# Patient Record
Sex: Female | Born: 1950 | Hispanic: No | Marital: Married | Smoking: Never smoker
Health system: Southern US, Community
[De-identification: ages and names within clinical notes are randomized; demographics above are authoritative.]

## PROBLEM LIST (undated history)

## (undated) DIAGNOSIS — J849 Interstitial pulmonary disease, unspecified: Secondary | ICD-10-CM

## (undated) DIAGNOSIS — I4891 Unspecified atrial fibrillation: Secondary | ICD-10-CM

## (undated) DIAGNOSIS — E119 Type 2 diabetes mellitus without complications: Secondary | ICD-10-CM

## (undated) DIAGNOSIS — M1990 Unspecified osteoarthritis, unspecified site: Secondary | ICD-10-CM

## (undated) DIAGNOSIS — D649 Anemia, unspecified: Secondary | ICD-10-CM

## (undated) DIAGNOSIS — R011 Cardiac murmur, unspecified: Secondary | ICD-10-CM

## (undated) DIAGNOSIS — I2699 Other pulmonary embolism without acute cor pulmonale: Secondary | ICD-10-CM

## (undated) DIAGNOSIS — J449 Chronic obstructive pulmonary disease, unspecified: Secondary | ICD-10-CM

## (undated) DIAGNOSIS — Z952 Presence of prosthetic heart valve: Secondary | ICD-10-CM

## (undated) DIAGNOSIS — J45909 Unspecified asthma, uncomplicated: Secondary | ICD-10-CM

## (undated) DIAGNOSIS — Z5189 Encounter for other specified aftercare: Secondary | ICD-10-CM

## (undated) DIAGNOSIS — I50810 Right heart failure, unspecified: Secondary | ICD-10-CM

## (undated) DIAGNOSIS — I10 Essential (primary) hypertension: Secondary | ICD-10-CM

## (undated) DIAGNOSIS — I272 Pulmonary hypertension, unspecified: Secondary | ICD-10-CM

## (undated) DIAGNOSIS — G459 Transient cerebral ischemic attack, unspecified: Secondary | ICD-10-CM

## (undated) HISTORY — PX: OTHER SURGICAL HISTORY: SHX169

## (undated) HISTORY — PX: TUBAL LIGATION: SHX77

## (undated) HISTORY — PX: CARDIAC CATHETERIZATION: SHX172

---

## 2000-05-17 ENCOUNTER — Ambulatory Visit (HOSPITAL_COMMUNITY): Admission: RE | Admit: 2000-05-17 | Discharge: 2000-05-17 | Payer: Self-pay | Admitting: Gastroenterology

## 2003-07-23 ENCOUNTER — Other Ambulatory Visit: Payer: Self-pay

## 2004-05-27 ENCOUNTER — Ambulatory Visit: Payer: Self-pay | Admitting: Internal Medicine

## 2004-06-15 ENCOUNTER — Ambulatory Visit: Payer: Self-pay | Admitting: Internal Medicine

## 2004-07-20 ENCOUNTER — Ambulatory Visit: Payer: Self-pay | Admitting: Specialist

## 2004-07-21 ENCOUNTER — Inpatient Hospital Stay: Payer: Self-pay | Admitting: Urology

## 2004-08-03 ENCOUNTER — Ambulatory Visit: Payer: Self-pay | Admitting: Internal Medicine

## 2005-01-24 ENCOUNTER — Other Ambulatory Visit: Payer: Self-pay

## 2005-01-24 ENCOUNTER — Inpatient Hospital Stay: Payer: Self-pay | Admitting: Specialist

## 2005-02-17 ENCOUNTER — Other Ambulatory Visit: Payer: Self-pay

## 2005-02-17 ENCOUNTER — Ambulatory Visit: Payer: Self-pay | Admitting: Specialist

## 2005-02-21 ENCOUNTER — Ambulatory Visit: Payer: Self-pay | Admitting: Specialist

## 2005-04-19 ENCOUNTER — Other Ambulatory Visit: Payer: Self-pay

## 2005-04-19 ENCOUNTER — Inpatient Hospital Stay: Payer: Self-pay | Admitting: Specialist

## 2005-04-20 ENCOUNTER — Other Ambulatory Visit: Payer: Self-pay

## 2005-05-29 ENCOUNTER — Ambulatory Visit: Payer: Self-pay | Admitting: Specialist

## 2005-06-01 ENCOUNTER — Ambulatory Visit: Payer: Self-pay | Admitting: Gastroenterology

## 2005-06-01 ENCOUNTER — Other Ambulatory Visit: Payer: Self-pay

## 2005-06-07 ENCOUNTER — Ambulatory Visit: Payer: Self-pay | Admitting: Specialist

## 2006-06-15 ENCOUNTER — Ambulatory Visit: Payer: Self-pay | Admitting: Specialist

## 2006-07-10 ENCOUNTER — Ambulatory Visit: Payer: Self-pay

## 2006-10-02 ENCOUNTER — Ambulatory Visit: Payer: Self-pay | Admitting: Specialist

## 2006-10-29 ENCOUNTER — Other Ambulatory Visit: Payer: Self-pay

## 2006-10-29 ENCOUNTER — Emergency Department: Payer: Self-pay | Admitting: Emergency Medicine

## 2007-01-02 ENCOUNTER — Ambulatory Visit: Payer: Self-pay | Admitting: Gastroenterology

## 2007-03-12 ENCOUNTER — Ambulatory Visit: Payer: Self-pay | Admitting: Specialist

## 2007-08-08 ENCOUNTER — Ambulatory Visit: Payer: Self-pay | Admitting: Specialist

## 2007-10-11 ENCOUNTER — Other Ambulatory Visit: Payer: Self-pay

## 2007-10-11 ENCOUNTER — Inpatient Hospital Stay: Payer: Self-pay | Admitting: Specialist

## 2007-10-17 ENCOUNTER — Ambulatory Visit: Payer: Self-pay | Admitting: Specialist

## 2008-05-12 ENCOUNTER — Ambulatory Visit: Payer: Self-pay | Admitting: Specialist

## 2009-04-09 ENCOUNTER — Ambulatory Visit: Payer: Self-pay | Admitting: Specialist

## 2009-07-23 IMAGING — US ABDOMEN ULTRASOUND
1 series · 17 of 25 positions shown · non-contrast
Comparison: none

REASON FOR EXAM: epigastric pain with previous evidence of sludge on
ultrasound [id number]
COMMENTS:

[Series 1: abdomen ultrasound · 17 of 61 slices shown]
[im 1/61]
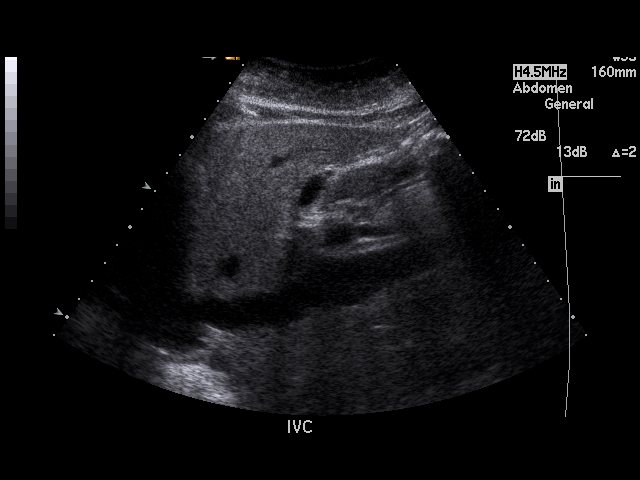
[im 6/61]
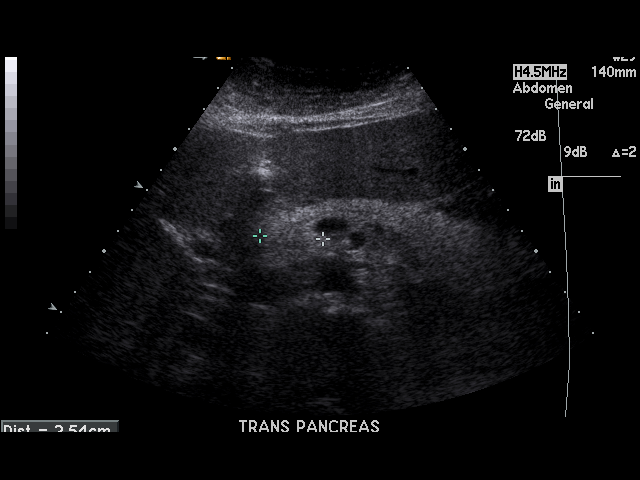
[im 8/61]
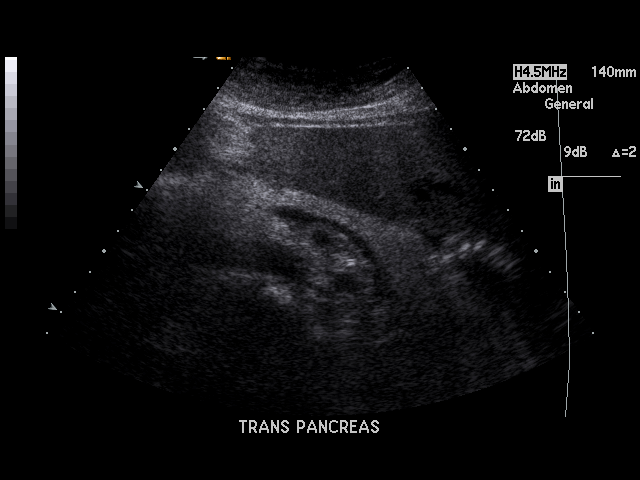
[im 13/61]
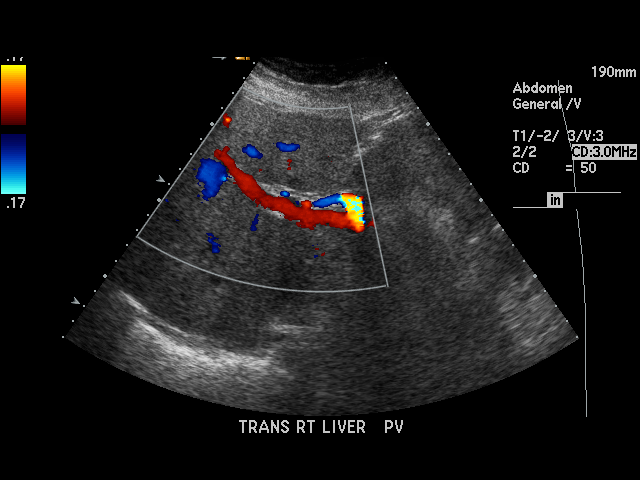
[im 16/61]
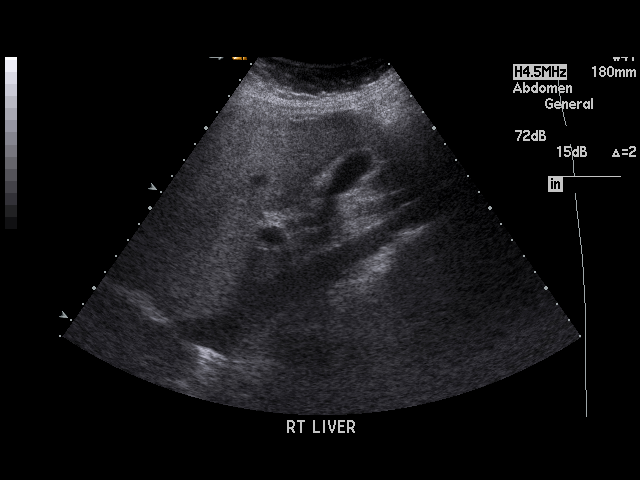
[im 21/61]
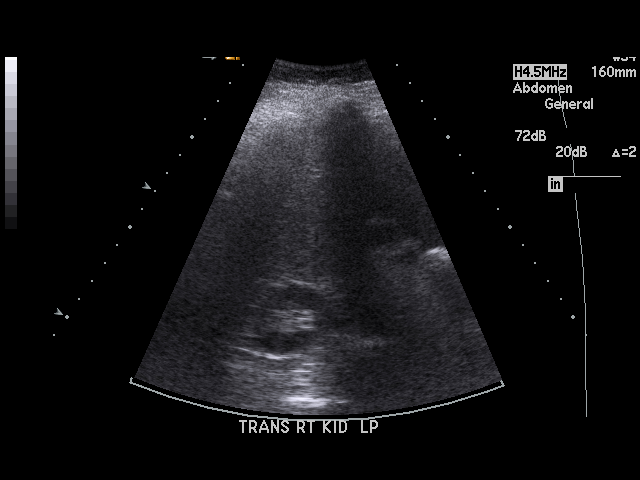
[im 23/61]
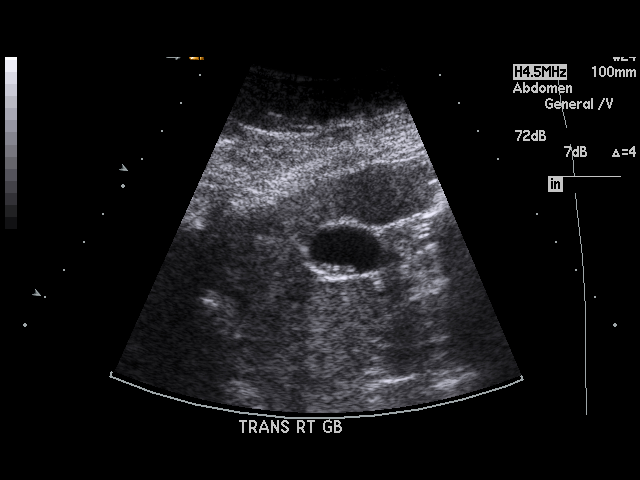
[im 28/61]
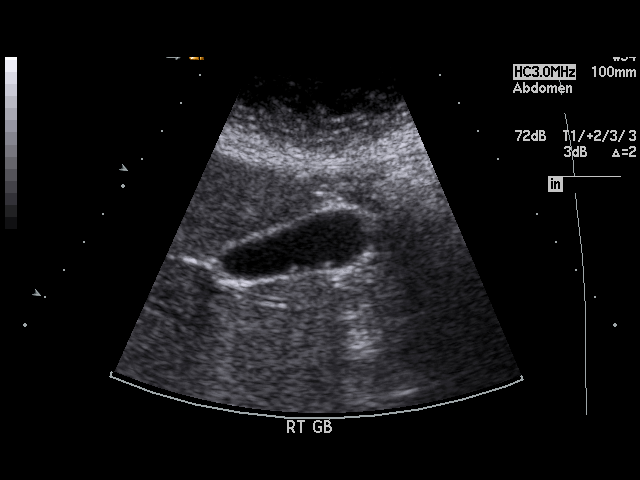
[im 31/61]
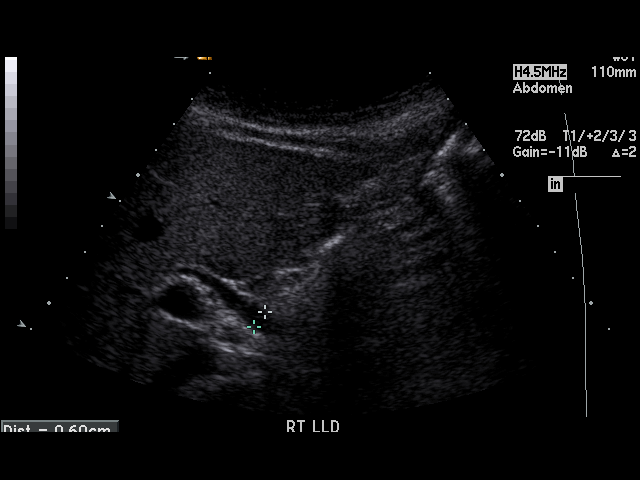
[im 33/61]
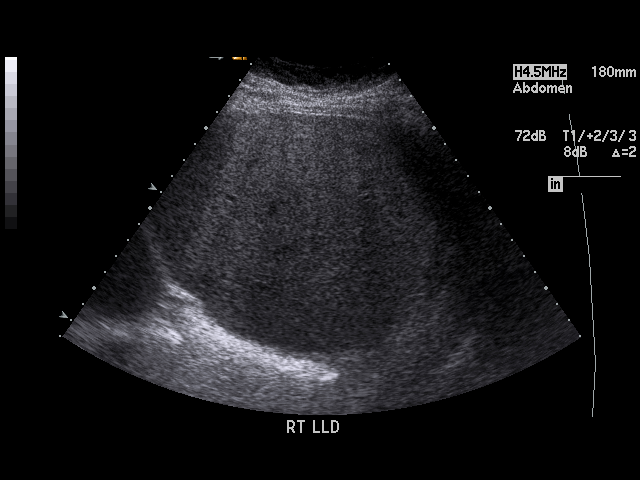
[im 38/61]
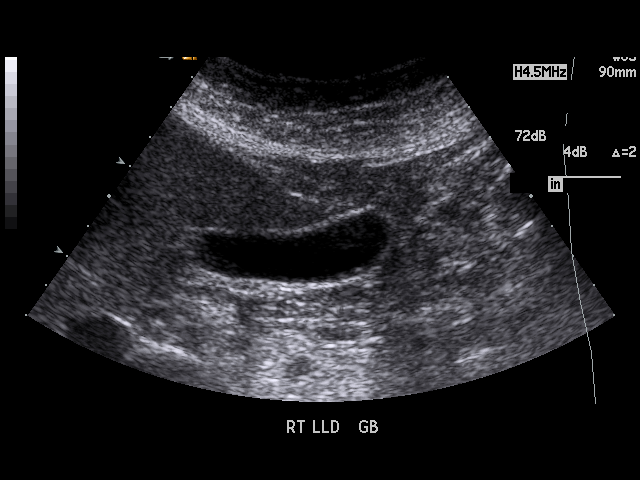
[im 41/61]
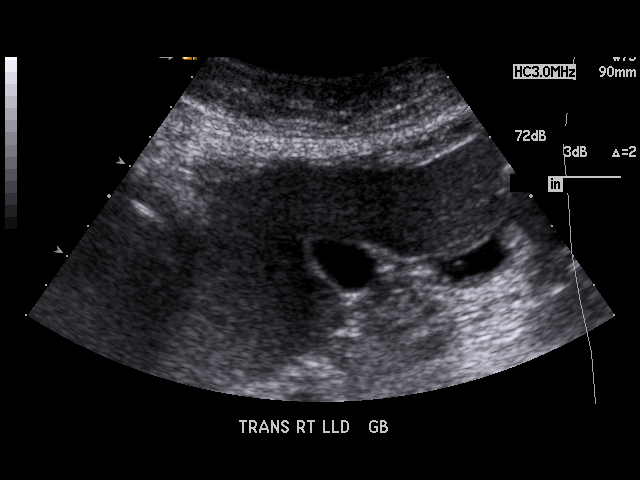
[im 46/61]
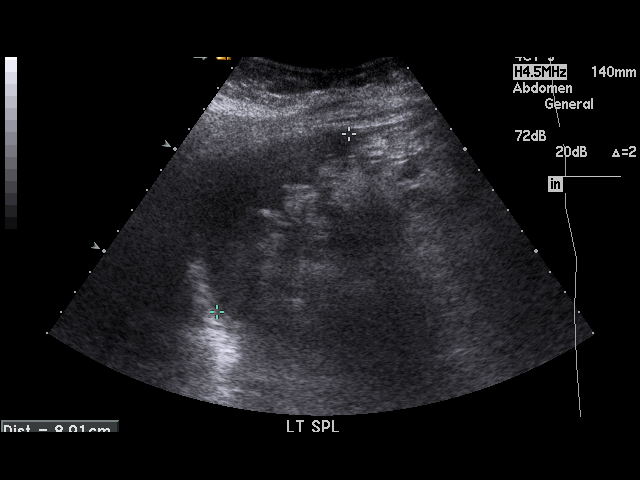
[im 48/61]
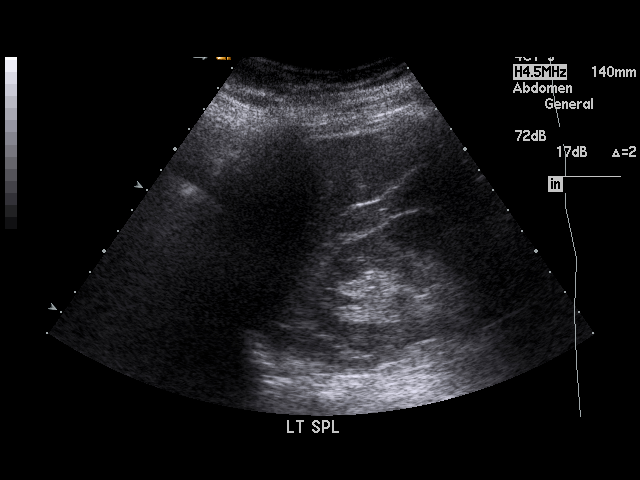
[im 53/61]
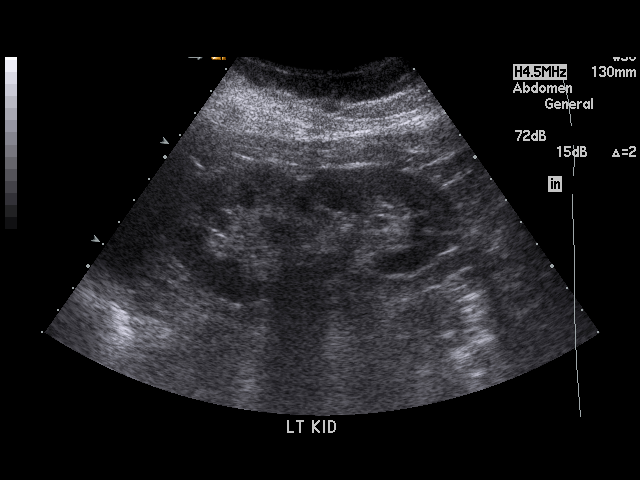
[im 56/61]
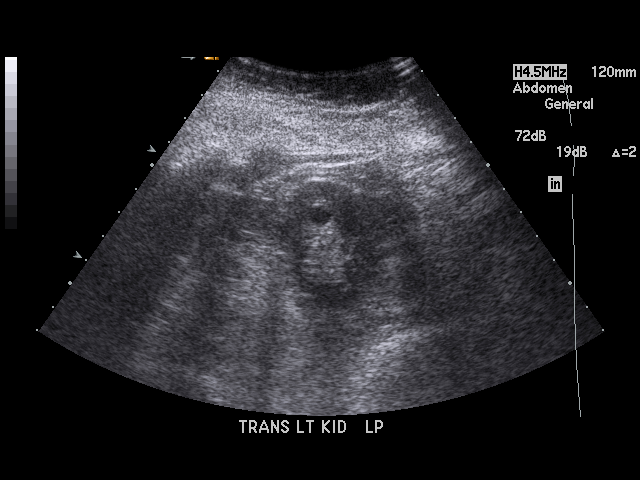
[im 61/61]
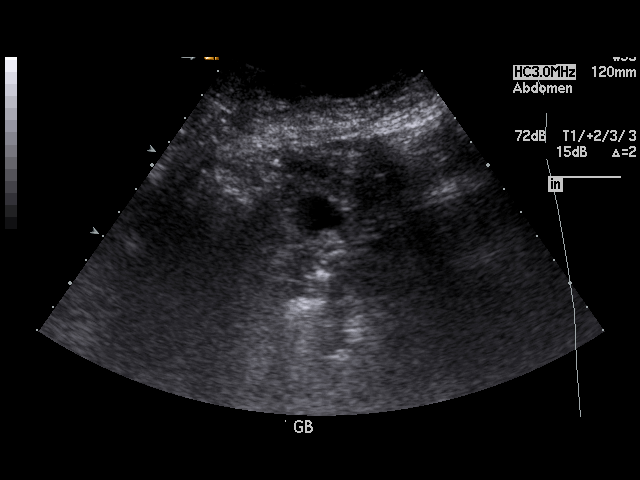

[17 of 25 positions shown; findings below may reference images not displayed]

PROCEDURE:     US  - US ABDOMEN GENERAL SURVEY  - October 16, 2007  [DATE]

RESULT:     The liver, spleen, pancreas, abdominal aorta and inferior vena
cava show no significant abnormalities. There are noted multiple mobile echo
densities in the gallbladder. There is apparent faint shadowing associated
with these densities. It is not entirely certain that the observed shadowing
is in fact related to the echo densities. It is recommended that the patient
have a followup gallbladder ultrasound to determinate if shadowing can be
further substantiated. There is no thickening of the gallbladder wall. The
common bile duct measures 6.1 mm in diameter. This is within the limits of
normal, but the common duct does appear to have increased in size since the
prior exam of January 02, 2007, at which time the common duct measured
mm in diameter. No dilated intrahepatic ducts are seen. No ascites is noted.
The kidneys show no hydronephrosis.
IMPRESSION: 1.     Possible cholelithiasis. There are mobile echo densities in the
gallbladder with possible shadowing observed. Since the findings are
equivocal, followup gallbladder ultrasound is recommended to see if
shadowing can be further substantiated.
2.     The common bile duct is within normal limits for size but does appear
to have increased in size since the prior exam.
3.     There is no thickening of the gallbladder wall.
4.     No ascites is seen.

## 2009-09-20 ENCOUNTER — Ambulatory Visit: Payer: Self-pay | Admitting: Specialist

## 2010-06-30 ENCOUNTER — Ambulatory Visit: Payer: Self-pay | Admitting: Specialist

## 2011-04-13 ENCOUNTER — Ambulatory Visit: Payer: Self-pay | Admitting: Specialist

## 2011-07-28 ENCOUNTER — Other Ambulatory Visit: Payer: Self-pay | Admitting: Internal Medicine

## 2011-07-31 ENCOUNTER — Other Ambulatory Visit: Payer: Self-pay | Admitting: Specialist

## 2011-07-31 LAB — PROTIME-INR
INR: 1.4
Prothrombin Time: 17.8 secs — ABNORMAL HIGH (ref 11.5–14.7)

## 2011-08-03 ENCOUNTER — Other Ambulatory Visit: Payer: Self-pay | Admitting: Specialist

## 2011-08-03 LAB — PROTIME-INR
INR: 2
Prothrombin Time: 23.1 secs — ABNORMAL HIGH (ref 11.5–14.7)

## 2011-11-29 ENCOUNTER — Ambulatory Visit: Payer: Self-pay | Admitting: Specialist

## 2011-11-30 ENCOUNTER — Inpatient Hospital Stay: Payer: Self-pay | Admitting: Internal Medicine

## 2011-11-30 LAB — CBC WITH DIFFERENTIAL/PLATELET
Basophil #: 0.1 10*3/uL (ref 0.0–0.1)
Basophil %: 0.6 %
Eosinophil #: 0.4 10*3/uL (ref 0.0–0.7)
Lymphocyte #: 1.8 10*3/uL (ref 1.0–3.6)
Lymphocyte %: 16.5 %
MCHC: 34 g/dL (ref 32.0–36.0)
Monocyte %: 11.5 %
Neutrophil #: 7.3 10*3/uL — ABNORMAL HIGH (ref 1.4–6.5)
Neutrophil %: 67.7 %
Platelet: 243 10*3/uL (ref 150–440)
RDW: 17.6 % — ABNORMAL HIGH (ref 11.5–14.5)
WBC: 10.8 10*3/uL (ref 3.6–11.0)

## 2011-11-30 LAB — COMPREHENSIVE METABOLIC PANEL
Albumin: 3.3 g/dL — ABNORMAL LOW (ref 3.4–5.0)
Alkaline Phosphatase: 111 U/L (ref 50–136)
Chloride: 80 mmol/L — ABNORMAL LOW (ref 98–107)
Co2: 35 mmol/L — ABNORMAL HIGH (ref 21–32)
Creatinine: 0.64 mg/dL (ref 0.60–1.30)
EGFR (African American): >60
Glucose: 155 mg/dL — ABNORMAL HIGH (ref 65–99)
Potassium: 2.5 mmol/L — CL (ref 3.5–5.1)
SGOT(AST): 32 U/L (ref 15–37)
Sodium: 122 mmol/L — ABNORMAL LOW (ref 136–145)

## 2011-11-30 LAB — LIPASE, BLOOD: Lipase: 1172 U/L — ABNORMAL HIGH (ref 73–393)

## 2011-11-30 LAB — PROTIME-INR
INR: 2.1
Prothrombin Time: 23.5 secs — ABNORMAL HIGH (ref 11.5–14.7)

## 2011-11-30 LAB — CK TOTAL AND CKMB (NOT AT ARMC)
CK, Total: 90 U/L (ref 21–215)
CK-MB: 0.7 ng/mL (ref 0.5–3.6)

## 2011-11-30 LAB — PRO B NATRIURETIC PEPTIDE: B-Type Natriuretic Peptide: 854 pg/mL — ABNORMAL HIGH (ref 0–125)

## 2011-11-30 LAB — MAGNESIUM: Magnesium: 2.5 mg/dL — ABNORMAL HIGH

## 2011-12-01 LAB — CBC WITH DIFFERENTIAL/PLATELET
Basophil #: 0.1 10*3/uL (ref 0.0–0.1)
Basophil %: 0.7 %
Eosinophil %: 8.3 %
HCT: 31.3 % — ABNORMAL LOW (ref 35.0–47.0)
HGB: 10.6 g/dL — ABNORMAL LOW (ref 12.0–16.0)
Lymphocyte %: 26.4 %
Monocyte #: 1.1 x10 3/mm — ABNORMAL HIGH (ref 0.2–0.9)
Monocyte %: 12.6 %
Neutrophil #: 4.6 10*3/uL (ref 1.4–6.5)
RBC: 3.77 10*6/uL — ABNORMAL LOW (ref 3.80–5.20)

## 2011-12-01 LAB — BASIC METABOLIC PANEL
Anion Gap: 8 (ref 7–16)
BUN: 11 mg/dL (ref 7–18)
Calcium, Total: 8.6 mg/dL (ref 8.5–10.1)
EGFR (African American): >60
EGFR (Non-African Amer.): >60
Glucose: 86 mg/dL (ref 65–99)
Potassium: 3.2 mmol/L — ABNORMAL LOW (ref 3.5–5.1)
Sodium: 133 mmol/L — ABNORMAL LOW (ref 136–145)

## 2011-12-01 LAB — PROTIME-INR: INR: 2.2

## 2011-12-24 ENCOUNTER — Emergency Department: Payer: Self-pay | Admitting: Emergency Medicine

## 2011-12-24 LAB — COMPREHENSIVE METABOLIC PANEL
Alkaline Phosphatase: 103 U/L (ref 50–136)
BUN: 11 mg/dL (ref 7–18)
Calcium, Total: 8.1 mg/dL — ABNORMAL LOW (ref 8.5–10.1)
Chloride: 106 mmol/L (ref 98–107)
Co2: 25 mmol/L (ref 21–32)
Creatinine: 0.88 mg/dL (ref 0.60–1.30)
EGFR (African American): >60
EGFR (Non-African Amer.): >60
SGOT(AST): 32 U/L (ref 15–37)
SGPT (ALT): 18 U/L (ref 12–78)
Total Protein: 7.6 g/dL (ref 6.4–8.2)

## 2011-12-24 LAB — PRO B NATRIURETIC PEPTIDE: B-Type Natriuretic Peptide: 1068 pg/mL — ABNORMAL HIGH (ref 0–125)

## 2011-12-24 LAB — TROPONIN I: Troponin-I: <0.02 ng/mL

## 2011-12-24 LAB — CBC
HCT: 32.1 % — ABNORMAL LOW (ref 35.0–47.0)
HGB: 10.7 g/dL — ABNORMAL LOW (ref 12.0–16.0)
MCHC: 33.5 g/dL (ref 32.0–36.0)
RDW: 19.3 % — ABNORMAL HIGH (ref 11.5–14.5)
WBC: 8.2 10*3/uL (ref 3.6–11.0)

## 2011-12-24 LAB — CK TOTAL AND CKMB (NOT AT ARMC): CK-MB: 0.7 ng/mL (ref 0.5–3.6)

## 2012-05-13 ENCOUNTER — Ambulatory Visit: Payer: Self-pay | Admitting: Specialist

## 2012-05-13 LAB — CBC WITH DIFFERENTIAL/PLATELET
Basophil %: 0.9 %
Eosinophil #: 0.3 10*3/uL (ref 0.0–0.7)
Eosinophil %: 3.2 %
HGB: 9.9 g/dL — ABNORMAL LOW (ref 12.0–16.0)
Lymphocyte %: 18.1 %
MCHC: 31.7 g/dL — ABNORMAL LOW (ref 32.0–36.0)
MCV: 75 fL — ABNORMAL LOW (ref 80–100)
Neutrophil #: 6 10*3/uL (ref 1.4–6.5)
RBC: 4.16 10*6/uL (ref 3.80–5.20)
RDW: 20.2 % — ABNORMAL HIGH (ref 11.5–14.5)
WBC: 8.8 10*3/uL (ref 3.6–11.0)

## 2012-05-13 LAB — COMPREHENSIVE METABOLIC PANEL
Albumin: 3.3 g/dL — ABNORMAL LOW (ref 3.4–5.0)
Alkaline Phosphatase: 135 U/L (ref 50–136)
BUN: 12 mg/dL (ref 7–18)
Bilirubin,Total: 0.6 mg/dL (ref 0.2–1.0)
Calcium, Total: 7.8 mg/dL — ABNORMAL LOW (ref 8.5–10.1)
Creatinine: 0.86 mg/dL (ref 0.60–1.30)
EGFR (Non-African Amer.): >60
Osmolality: 276 (ref 275–301)
SGOT(AST): 28 U/L (ref 15–37)
SGPT (ALT): 18 U/L (ref 12–78)
Sodium: 137 mmol/L (ref 136–145)

## 2012-05-13 LAB — TSH: Thyroid Stimulating Horm: 2.4 u[IU]/mL

## 2012-05-13 LAB — PROTIME-INR: Prothrombin Time: 24.6 secs — ABNORMAL HIGH (ref 11.5–14.7)

## 2012-09-27 ENCOUNTER — Ambulatory Visit: Payer: Self-pay | Admitting: Gastroenterology

## 2013-05-07 ENCOUNTER — Ambulatory Visit: Payer: Self-pay | Admitting: Internal Medicine

## 2013-05-19 ENCOUNTER — Ambulatory Visit: Payer: Self-pay | Admitting: Family Medicine

## 2013-09-06 IMAGING — CR DG CHEST 2V
1 series · 2 of 2 positions shown · non-contrast
Comparison: none

REASON FOR EXAM: Cough
COMMENTS:

[Series 2: w chest pa · 0.14mm/px · 2 of 2 slices shown]
[im 1/2]
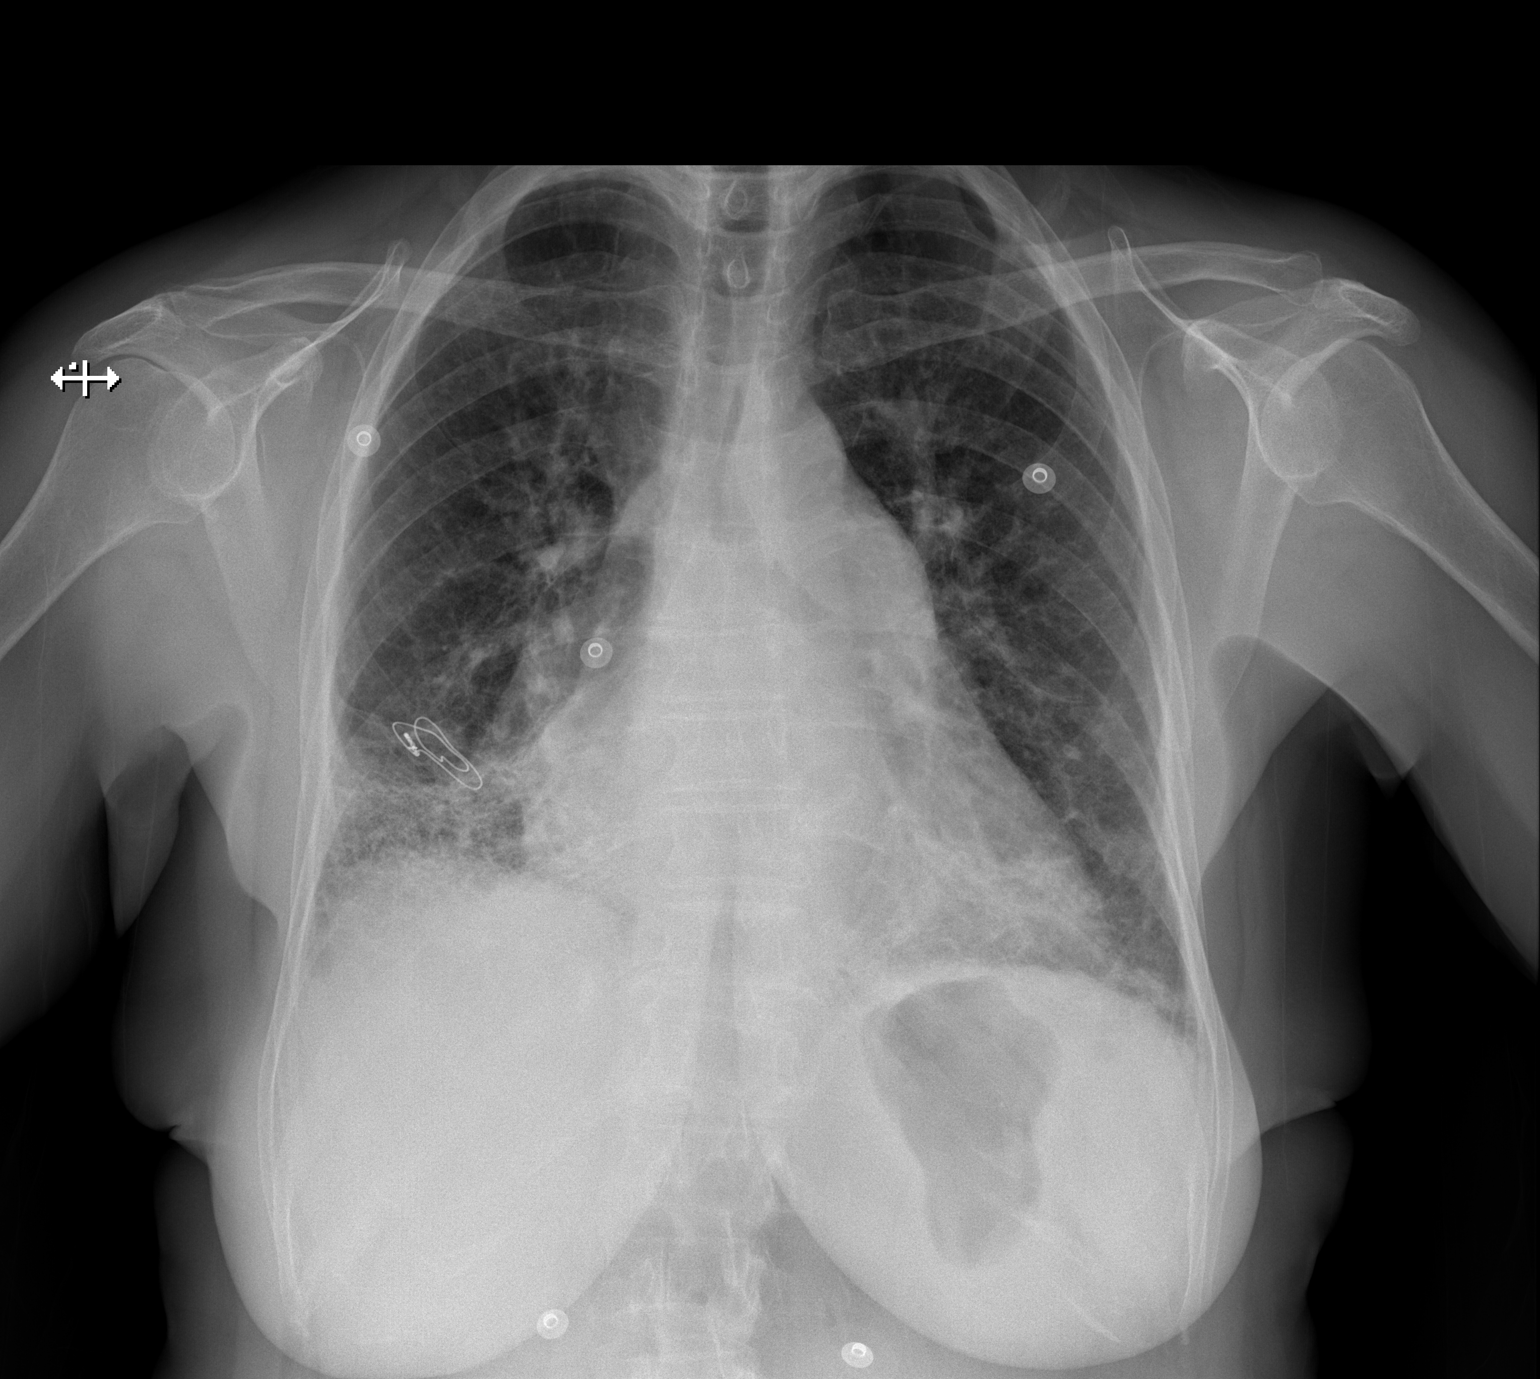
[im 2/2]
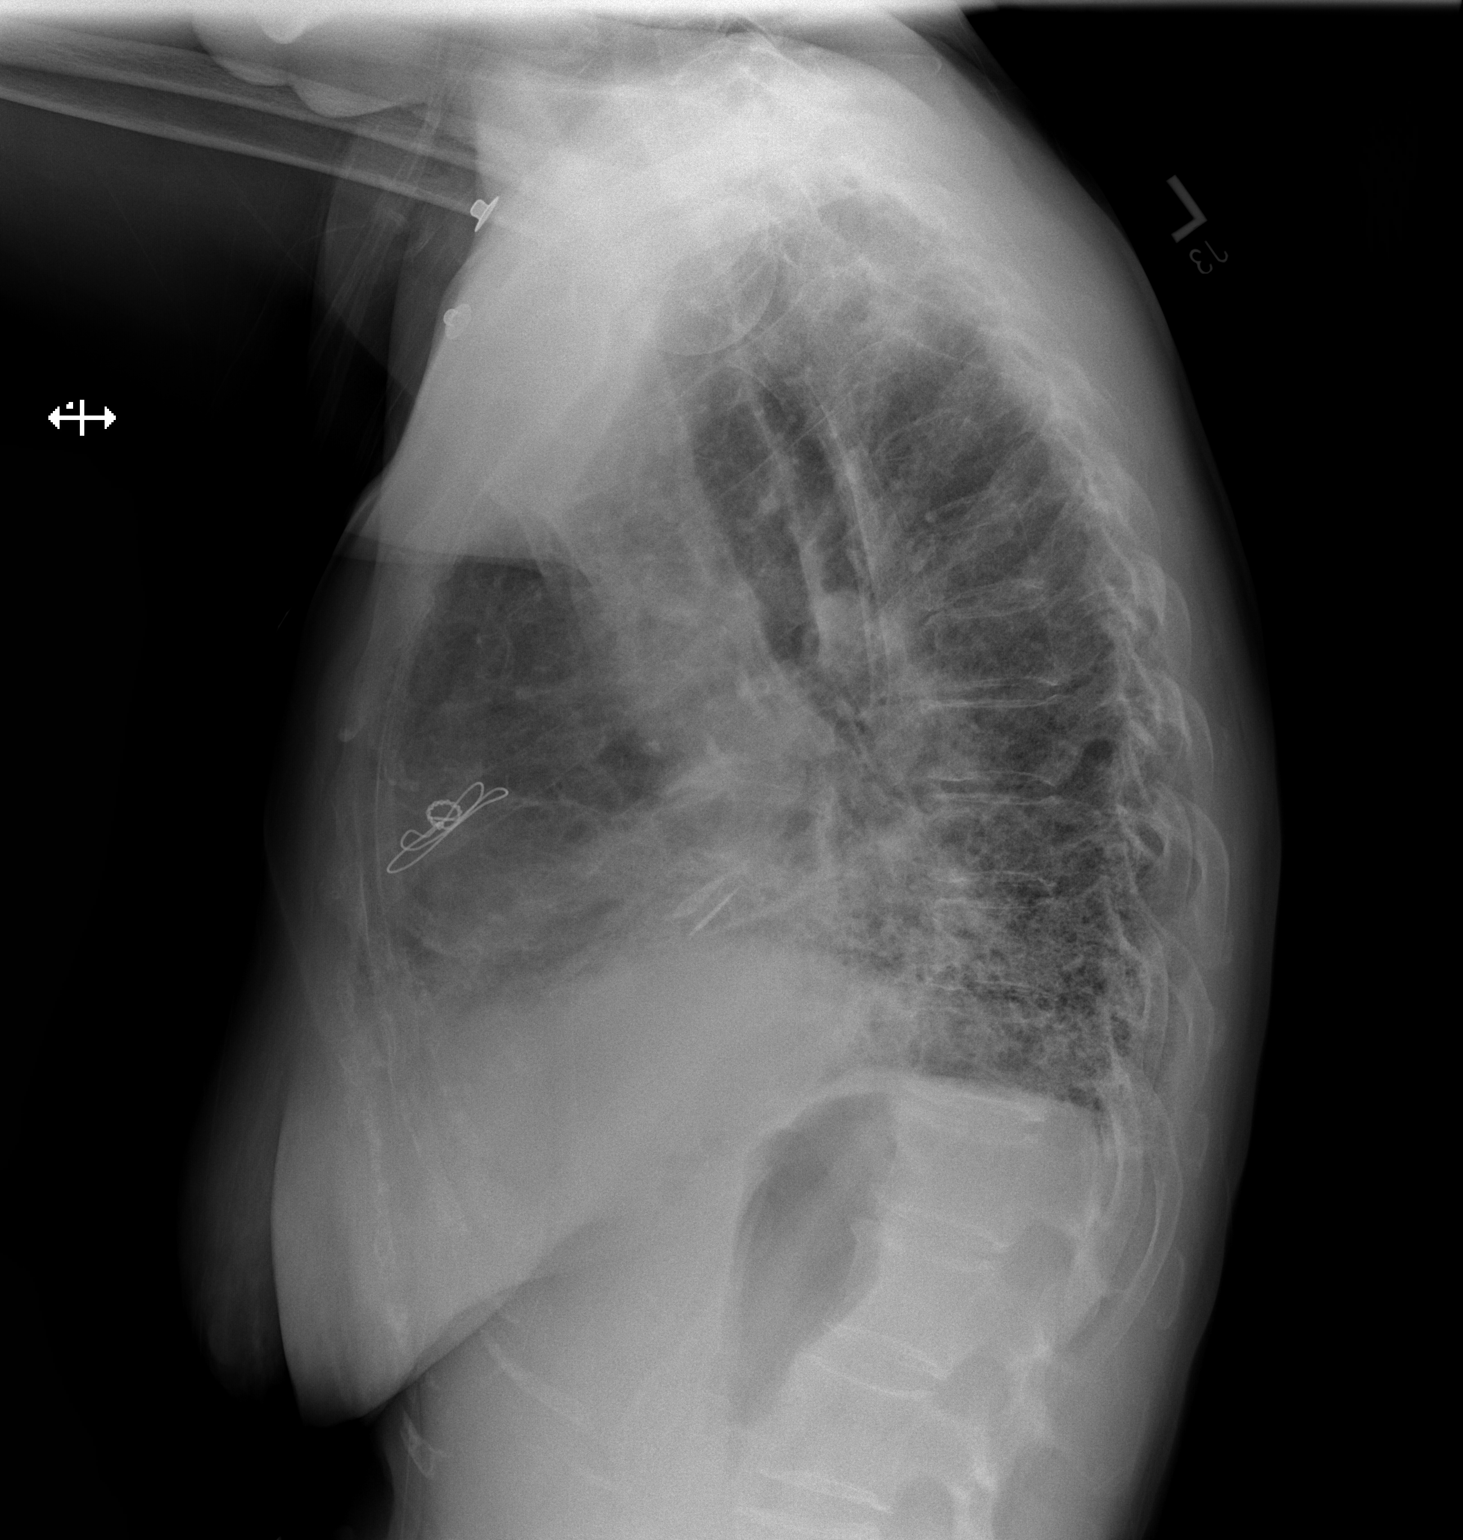

[2 of 2 positions shown; findings below may reference images not displayed]

PROCEDURE:     DXR - DXR CHEST PA (OR AP) AND LATERAL  - November 30, 2011  [DATE]

RESULT:     There is no previous exam for comparison.

The heart is mildly enlarged. The lung markings are coarse diffusely and
especially increased in the lung bases. Findings are concerning for right
middle lobe and right lower lobe as well as lingular infiltrate versus
atelectasis superimposed on chronic fibrotic changes of interstitial lung
disease. No large effusion or definite mass is present.
IMPRESSION: 1. Cardiomegaly.
2. Emphysematous lung disease with fibrosis.
3. Cannot exclude bilateral lung base infiltrate versus atelectasis.
Followup to document clearing is recommended.

[REDACTED]

## 2013-09-07 IMAGING — US ABDOMEN ULTRASOUND LIMITED
1 series · 14 of 25 positions shown · non-contrast
Comparison: none

REASON FOR EXAM: abdominal pain
COMMENTS:   Body Site: GB and Fossa, CBD, Head of Pancreas

[Series 1: abdomen ultrasound limited · 0.26mm/px · 14 of 46 slices shown]
[im 1/46]
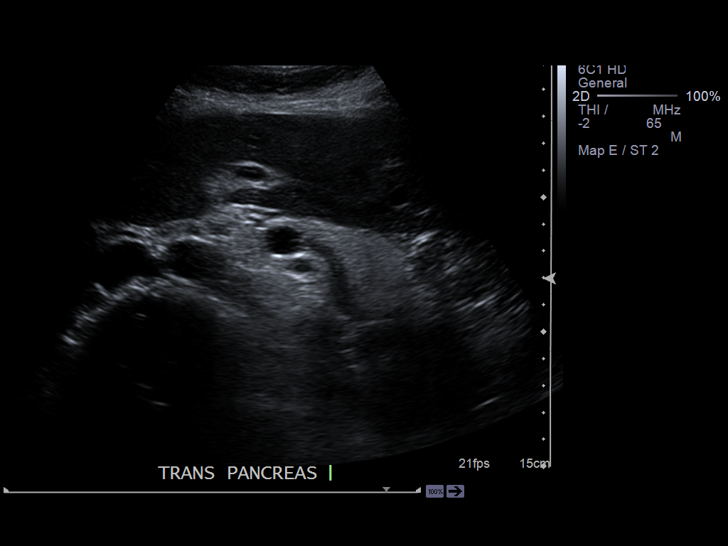
[im 4/46]
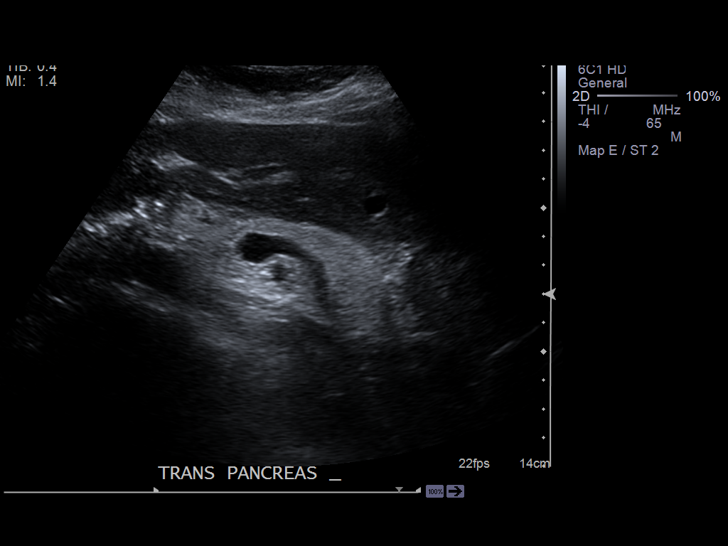
[im 8/46]
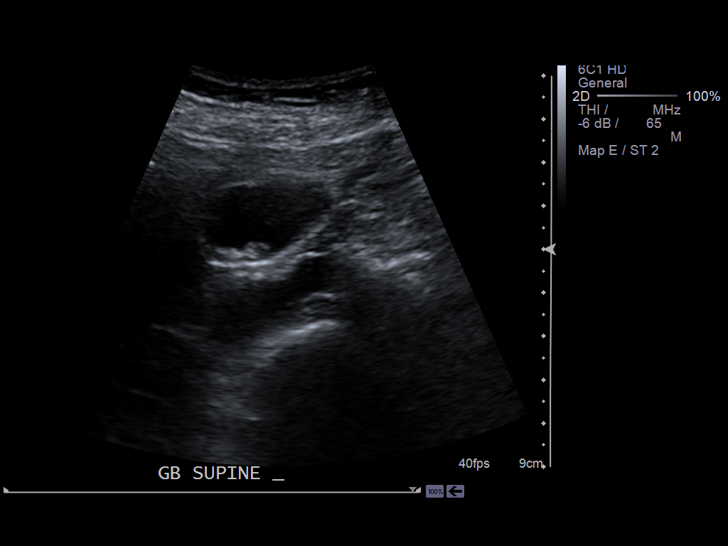
[im 12/46]
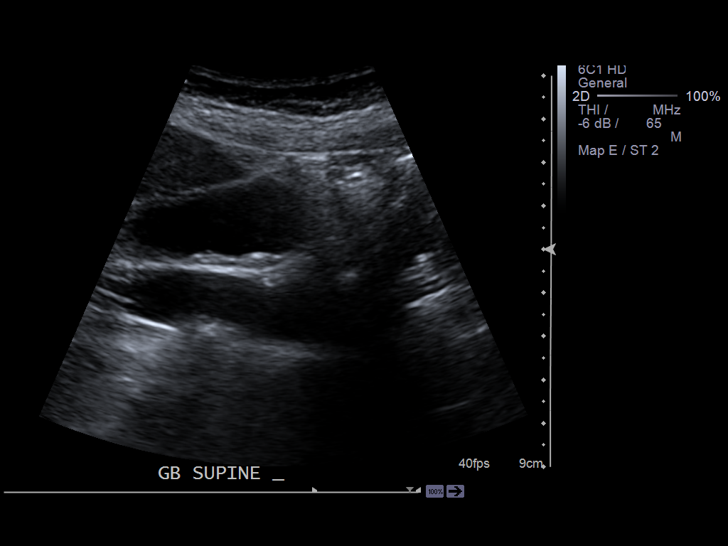
[im 16/46]
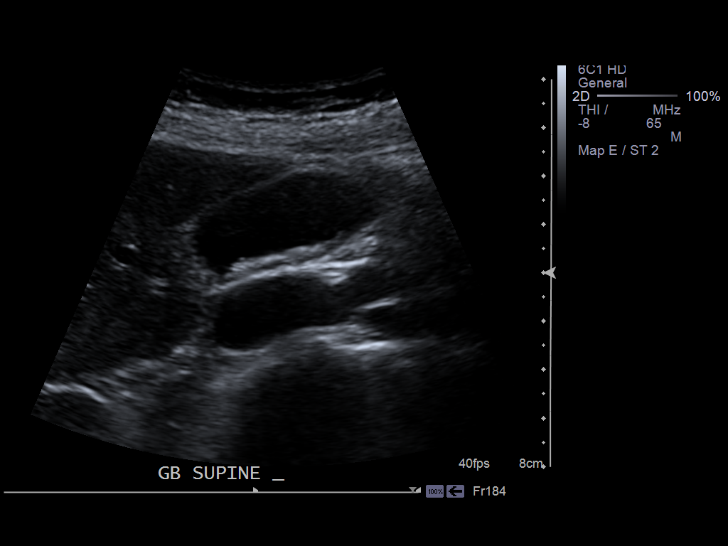
[im 17/46]
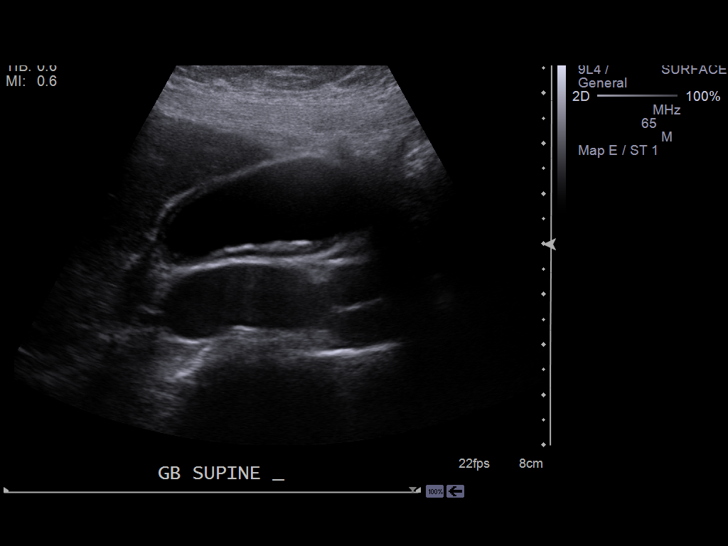
[im 21/46]
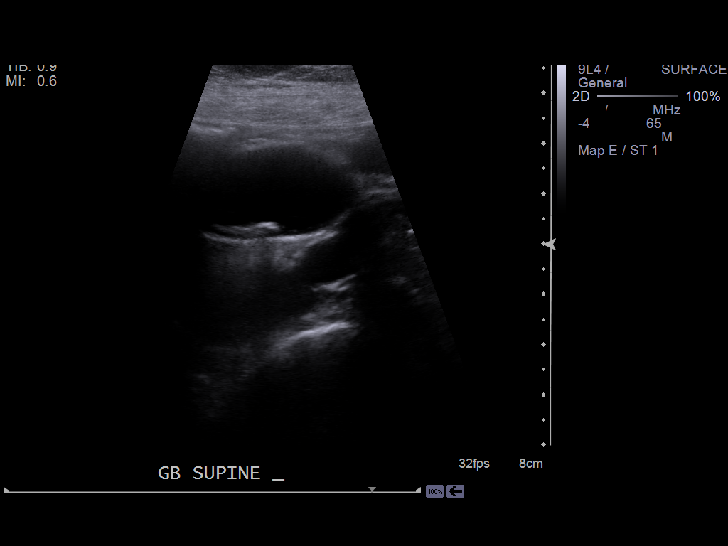
[im 25/46]
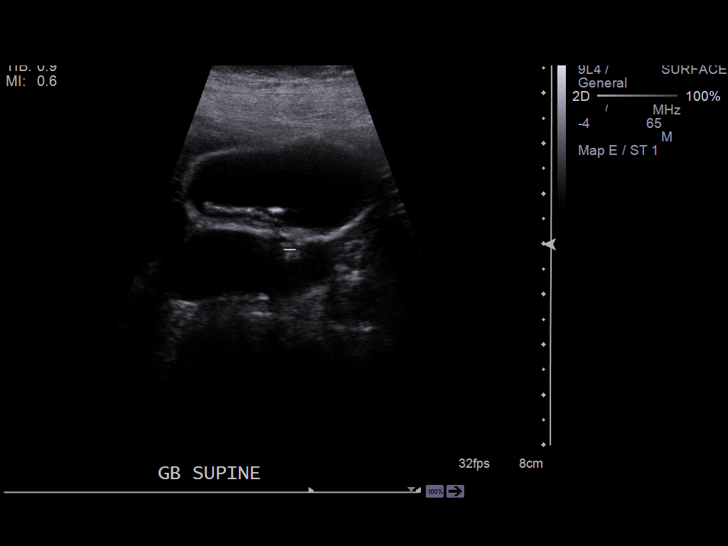
[im 29/46]
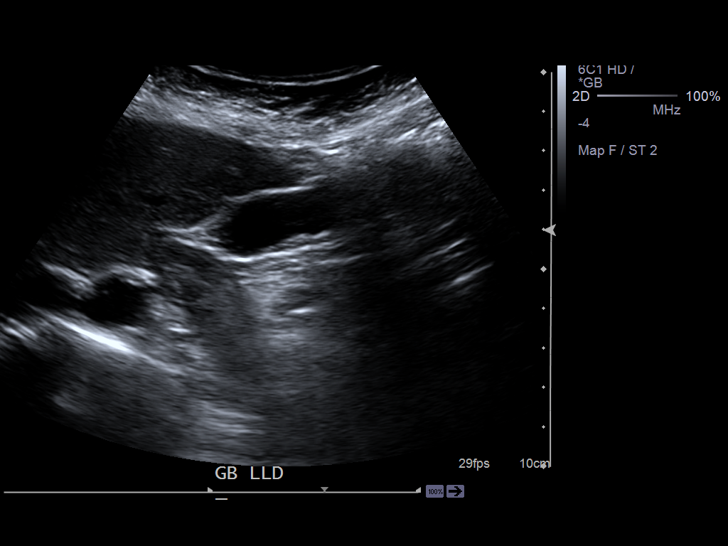
[im 31/46]
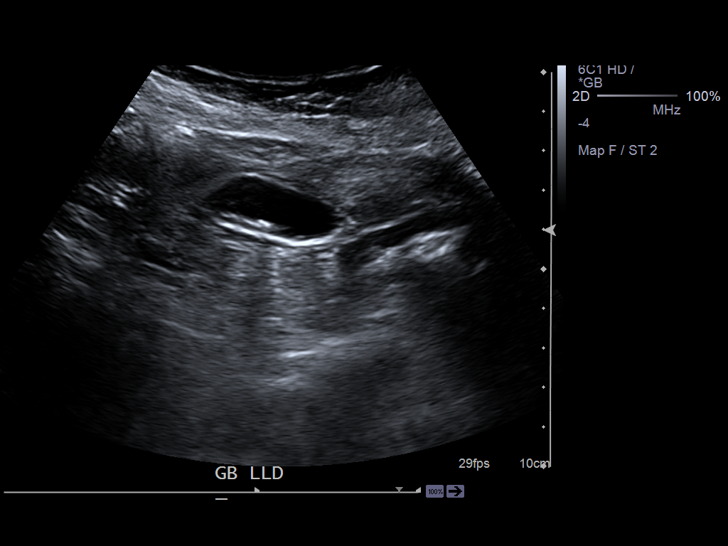
[im 34/46]
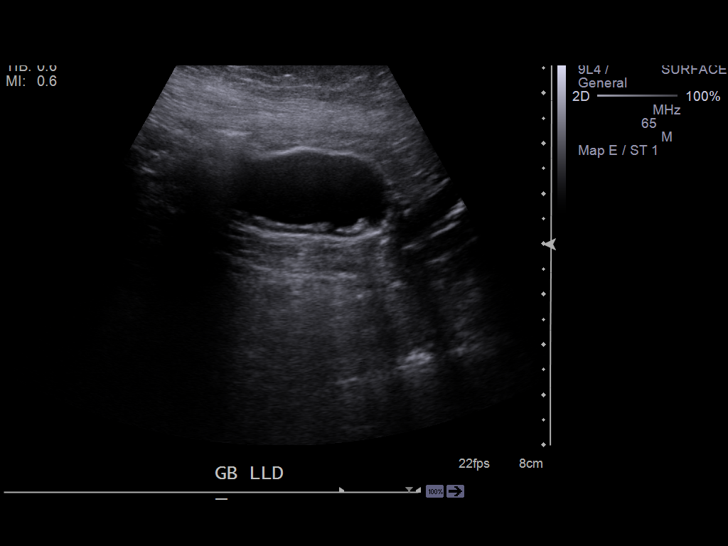
[im 38/46]
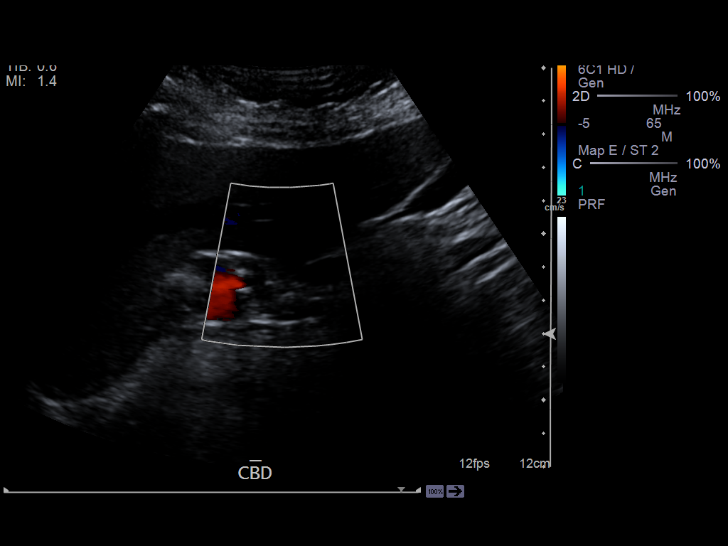
[im 42/46]
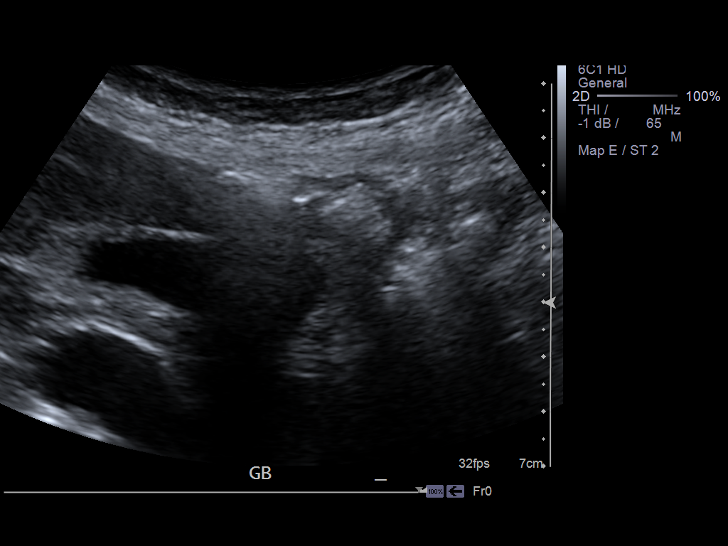
[im 46/46]
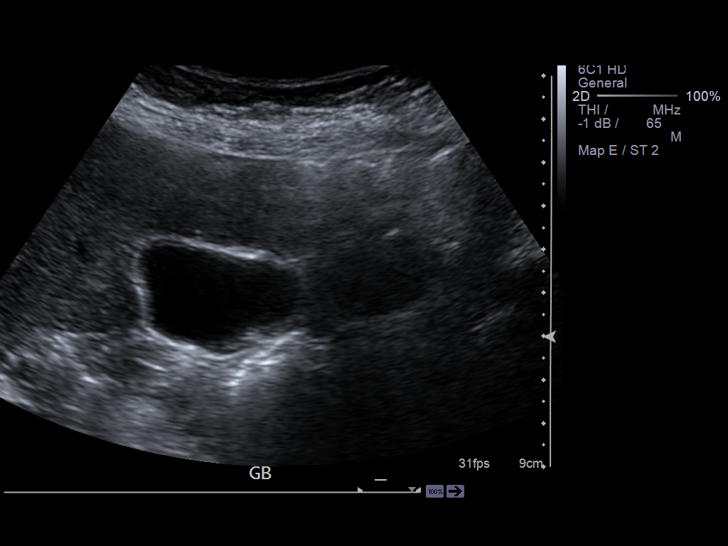

[14 of 25 positions shown; findings below may reference images not displayed]

PROCEDURE:     US  - US ABDOMEN LIMITED SURVEY  - December 01, 2011  [DATE]

RESULT:     Limited abdominal ultrasound was perfor[REDACTED]ed in the right
upper quadrant.

The observed portions of the liver exhibit no focal mass nor ductal
dilation. Portal venous flow is normal in direction toward the liver. The
gallbladder is adequately distended and contains multiple echogenic
shadowing stones layering in the dependent portion of the gallbladder. There
is no positive sonographic Murphy's sign. There is no gallbladder wall
thickening or pericholecystic fluid. The common bile duct is normal at
mm in diameter. The pancreatic head exhibits normal echotexture with no
focal mass or ductal dilation. The observed portions of the pancreatic body
appear normal as well.
IMPRESSION: 1. There are multiple mobile shadowing gallstones. There is no sonographic
evidence of acute cholecystitis.
2. The observed portions of the liver and pancreas are normal in appearance.

[REDACTED]

## 2013-12-10 ENCOUNTER — Ambulatory Visit: Payer: Self-pay | Admitting: Internal Medicine

## 2014-06-30 NOTE — H&P (Signed)
**Note Erin-Identified via Obfuscation** PATIENT NAME:  Erin Mcdowell, Erin G MR#:  [id number] DATE OF BIRTH:  Jul 19, 1950  DATE OF ADMISSION:  11/30/2011  PRIMARY CARE PHYSICIAN: Orson AloeMichael Blocker, [name], [name] The patient has a history of diabetes, chronic obstructive pulmonary disease, interstitial lung disease previously stated as pulmonary fibrosis, gastroesophageal reflux disease, pulmonary hypertension, status post mitral valve replacement and atrial fibrillation, also has a history of congestive heart failure, who was [facility]. At that time, according to her, she had fluid retention and had aggressive diuresis. She was subsequently discharged on a new fluid pill, supposedly Zaroxolyn, who went to see Dr. Leavy CellaBlocker for a routine follow-up and had blood work checked which showed that her potassium was very low at 2.2. Her sodium was very low as well. Therefore, we were asked to admit the patient. The patient's other major complaint is that she has been having epigastric pain, sharp in nature, as well as right upper quadrant pain ongoing for the past few days as well. Her shortness of breath seems to be at baseline. She is on chronic oxygen. She has not had any nausea, vomiting, or diarrhea. She denies any chest pains, denies any palpitations. She denies any significant lower extremity swelling. She does have chronic dyspnea. She denies any urinary frequency, urgency, or hesitancy.   PAST MEDICAL HISTORY:  1. History of diabetes.  2. Chronic obstructive pulmonary disease.  3. History of interstitial lung disease, previously listed as pulmonary fibrosis.  4. Gastroesophageal reflux disease.  5. Pulmonary hypertension.  6. Mitral valve  replacement and chronic atrial fibrillation.  7. Moderate persistent asthma.  8. Osteoarthritis.  9. History of transient ischemic attack.  10. Prior tobacco use.   ALLERGIES: Aspirin, naproxen, penicillin, tetanus toxoid, and Ventolin.   PAST SURGICAL HISTORY: Status post mitral valve replacement.   SOCIAL HISTORY: Denies any smoking, alcohol, or drugs.   FAMILY HISTORY: Significant for hypertension.   CURRENT MEDICATIONS:  1. Coumadin 4 mg daily.  2. Digoxin 125 mcg daily.  3. Diltiazem 120 mg one tab p.o. daily.  4. Lasix 40 mg daily.  5. Glimepiride 2 mg daily.  6. Metformin 1000 mg one tab p.o. daily.  7. Metolazone 2.5 mg daily.  8. Nexium 40 mg one tab p.o. twice a day. 9. Raloxifene 60 mg daily.  10. Spironolactone 25 mg daily. 11. Tadalafil 20 mg daily.  12. Ambien 5 mg at bedtime p.r.n.   REVIEW OF SYSTEMS: CONSTITUTIONAL: Denies any fevers, chills. Has generalized weakness. No significant weight gain or weight loss. HEENT: Denies any visual difficulties. No blurred vision. No erythema. No history of cataracts. No glaucoma. ENT: Denies any tinnitus. No ear pain. No nasal discharge. No difficulty with swallowing. CARDIOVASCULAR: Has chronic atrial fibrillation, history of congestive heart failure. Has chronic dyspnea. Denies any significant swelling. No palpitations. No chest pains. No syncope. PULMONARY: Has chronic shortness of breath. Denies any cough. No hemoptysis. Has chronic interstitial lung disease. Has chronic obstructive pulmonary disease. GI: Complains of epigastric and right upper quadrant abdominal pain. Has not had any nausea or vomiting or diarrhea. GU: Denies any urinary frequency, urgency, or hesitancy. ENDOCRINE: Denies any polyuria. Does have diabetes. GENITOURINARY: Denies any dysuria, hematuria, renal calculus, or frequency.  ENDOCRINE: Denies any numbness, weakness. No cerebrovascular accident. No transient ischemic attack. PSYCHIATRIC: Not anxious or  depressed. SKIN: No rash.    PHYSICAL EXAMINATION:   VITAL SIGNS: Temperature 97.3, pulse 54, respirations 18, blood pressure 111/54, and O2 99%.   GENERAL: The patient is a chronically ill-appearing Spanish female currently not in any acute distress.   HEENT: Head atraumatic, normocephalic. Pupils equal, round, and reactive to light and accommodation. There is no conjunctival pallor. No scleral icterus. Oropharynx is clear without any exudates.  NECK: No thyromegaly. No carotid bruits. No JVD.   CARDIOVASCULAR: Irregularly irregular rhythm  without any murmurs, rubs, clicks, or gallops. PMI is not displaced.   LUNGS: Occasional crackles at both bases.   ABDOMEN: Epigastric tenderness without any guarding or rebound.   EXTREMITIES: No clubbing, cyanosis, or edema.   SKIN: No rash.   LYMPHATICS: No lymph nodes palpable.   VASCULAR: Good DP and DTP pulses.   PSYCHIATRIC: Not anxious or depressed.   NEUROLOGICAL: Awake, alert, and oriented x3. No focal deficits.  EVALUATIONS: EKG shows atrial fibrillation with incomplete right bundle branch block, nonspecific ST-T wave changes in inferior and anterior leads.  Glucose 155. BNP 854. BUN 9, creatinine 0.64, sodium 122, potassium 2.5, chloride 80, CO2 35, and calcium 8.8. LFTs: Albumin 3.3. CPK 90 and CK-MB 0.7. Troponin less than 0.02. Digoxin level 0.77. WBC 10.8, hemoglobin 11.5, and platelet count 243. INR 2.1.   Chest x-ray, PA and lateral, showed cardiomegaly and emphysematous changes with fibrosis.   ASSESSMENT AND PLAN: The patient is a 64 year old Spanish female with history of chronic obstructive pulmonary disease, diabetes, interstitial lung disease, pulmonary fibrosis, gastroesophageal reflux disease, and pulmonary hypertension who was hospitalized at [facility] in May for acute congestive heart failure and was discharged on Zaroxolyn. The patient had lab work yesterday in Dr. Sharrell Ku office and told to come here due to low  potassium.  1. Severe hypokalemia, likely due to Lasix and metolazone. We will replace her potassium, repeat a potassium check later today, and replace as needed.  2. Hyponatremia, likely due to diuretic therapy. We will hold her diuretics. We will check serum and urine osmoles. We will give her low dose fluid in light of severe hyponatremia and hypokalemia.  3. Status post mitral valve replacement. We will continue Coumadin.  4. Chronic systolic congestive heart failure. We will hold diuresis. We will continue digoxin. Follow up on digoxin level.  5. Diabetes. We will place her on sliding scale insulin, continue metformin.  6. Epigastric pain. We will increase her PPI to twice a day. We will check a lipase and check a right upper quadrant ultrasound to make sure she does not have a gallbladder process going on.  7. Gastroesophageal reflux disease. We will place her on PPI twice a day. 8. Miscellaneous. The patient is on Coumadin with INR therapeutic. This should be sufficient for DVT prophylaxis.  TIME SPENT: 45 minutes. ____________________________ Lacie Scotts Allena Katz, MD shp:slb D: 11/30/2011 11:01:59 ET T: 11/30/2011 11:55:02 ET JOB#: [id number]  cc: Shavontae Gibeault H. Allena Katz, MD, <Dictator> Rosalyn Gess. Blocker, MD Charise Carwin MD ELECTRONICALLY SIGNED 11/30/2011 21:52

## 2014-06-30 NOTE — Discharge Summary (Signed)
**Note Erin-Identified via Obfuscation** PATIENT NAME:  Erin Mcdowell, Arpi G MR#:  [id number] DATE OF BIRTH:  [birth year]  DATE OF ADMISSION:  11/30/2011 DATE OF DISCHARGE:  12/01/2011  ADMITTING DIAGNOSIS: Severe hypokalemia and hyponatremia.   DISCHARGE DIAGNOSES:  1. Hyponatremia and hypokalemia likely due to diuretic therapy. Now her sodium and potassium have significantly improved.  2. Epigastric pain, possibly due to gastritis, possibly due to symptomatic gallstones. The patient's PPIs have been increased. She will [facility] if symptoms persist for symptomatic gallstones.  3. Rheumatic heart disease with mitral stenosis status post [employer] mitral valve replacement in May 2006. Prior to that she had percutaneous balloon angioplasty in 2000.  4. Atrial fibrillation previously on amiodarone, discontinued due to chronic cough and subsequent diagnosis of interstitial lung disease, possibly due to amiodarone use.  5. 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  6. History of gastritis.  7. History of transient ischemic attack.  8. Esophageal dysmotility.  9. Type 2 diabetes.  10. History of right-sided heart failure due to pulmonary hypertension. She had a cardiac [facility] and [facility].  11. Osteoarthritis.  12. Prior tobacco use.   CONSULTANTS: None.   PERTINENT LABS AND EVALUATIONS:  EKG showed atrial fibrillation with incomplete right bundle branch block with nonspecific ST-T wave changes. Glucose 155, BNP 854, BUN 9, creatinine 0.64, sodium 122, potassium 2.5, chloride 80, CO2 35, calcium 8.8. LFTs were normal except albumin of 3.3. CPK 90. CK-MB 0.7. Troponin less than 0.02. DIG level was 0.77, WBC 10.8, hemoglobin 11.5, platelet count 243, INR 2.1. Chest x-ray PA and lateral showed cardiomegaly with emphysematous changes with possible fibrosis. The patient's laboratory evaluation this morning shows  sodium 133, potassium 3.2, chloride is 91, CO2 34. Her INR is 2.2.  Right upper quadrant ultrasound showed multiple mobile shadowing stones. There is no sonographic evidence of acute cholecystitis.   HOSPITAL COURSE: Please refer to the History and Physical done by me on presentation. The patient is a 64 year old Spanish female with history of multiple medical problems who is followed by Dr. Leavy CellaBlocker as well as [facility] there and cardiology there, who was hospitalized in May with acute right-sided heart failure and was noted to have severe pulmonary hypertension as well. The patient at that time had echocardiogram and cardiac catheterization done with normal ejection fraction. She was discharged on Lasix and spironolactone.  Subsequently Aldactone was added. The patient was on these treatments and had not felt well since she was started on Aldactone and spironolactone according to her. She went to follow up with Dr. Leavy CellaBlocker yesterday for routine followup with blood work checked which showed she had severe hyponatremia and severe hypokalemia. We were asked to admit the patient for further evaluation. The patient appeared euvolemic.  She was given only 500 mL of IV fluids with replacement of her potassium. Her sodium is improved significantly. Her diuretics were held. Her potassium is also improved. She will continue on potassium supplements at home. I will repeat another basic metabolic panel next Monday. Her spironolactone and Aldactone are currently being held. She will need to be reassessed to see if she needs to be continued on these. The patient also was complaining of epigastric pain. She underwent an ultrasound of her liver which showed gallstones. There was no evidence of cholecystitis. The patient also has a history of gastritis and was also constipated. Her abdominal pain is  improved so she will be discharged on PPIs b.i.d. At this time she is doing much better and is stable for  discharge.   DISCHARGE INSTRUCTIONS: The patient is to be weighed every day at the same time, preferably first thing in the morning. Call physician if gain of more than 2 pounds in one day or 5 pounds in one week. For increased weight gain, tiredness, swelling, and shortness of breath call 911.   DISCHARGE MEDICATIONS:  1. Tadalafil 20 mg daily.  2. Metformin 1000 mg, 1 tab p.o. daily.  3. Lasix 40 mg daily.  4. Digoxin 125 mcg daily.  5. Glimepiride 2 mg daily.  6. Diltiazem 120 mg daily.  7. Raloxifene 60 mg daily.  8. Coumadin 4 mg daily.  9. Nexium 1 tab p.o. b.i.d.  10. Tylenol 650 mg q. 4 p.r.n. for pain.  11. Klor-Con 20 mEq 1 tab p.o. b.i.d. times four days. 12. Colace 100, 1 tab p.o. b.i.d. as needed for constipation.  The patient is told to stop the spironolactone and metolazone for the time being. These need to be reassessed either by her primary or her cardiologist.   HOME OXYGEN: Yes, 2 liters.   DIET: Low sodium, carbohydrate consistent.   ACTIVITY: As tolerated.   TIMEFRAME FOR FOLLOWUP:  1. Follow up with Dr. Leavy Cella next week. Also needs a BMP check. 2. Follow up in 2 to 4 weeks with primary cardiologist at [facility]. 3. If symptoms persist with her abdominal pain she needs to be evaluated for symptomatic gallstones.  4. The patient is to have a basic metabolic panel checked Monday or Tuesday at Dr. Sharrell Ku office to follow up on the potassium and sodium.    TIME SPENT: 35 minutes.   ____________________________ Lacie Scotts Allena Katz, MD shp:bjt D: 12/01/2011 10:48:33 ET T: 12/04/2011 11:40:36 ET JOB#: [id number]  cc: Elward Nocera H. Allena Katz, MD, <Dictator> Rosalyn Gess. Blocker, MD Charise Carwin MD ELECTRONICALLY SIGNED 12/05/2011 15:00

## 2014-10-03 ENCOUNTER — Other Ambulatory Visit: Payer: Self-pay

## 2014-10-03 ENCOUNTER — Emergency Department
Admission: EM | Admit: 2014-10-03 | Discharge: 2014-10-03 | Disposition: A | Discharge status: Other Acute Inpatient Hospital | Payer: Medicare Other | Attending: Emergency Medicine | Admitting: Emergency Medicine

## 2014-10-03 DIAGNOSIS — R040 Epistaxis: Secondary | ICD-10-CM | POA: Diagnosis present

## 2014-10-03 DIAGNOSIS — M6281 Muscle weakness (generalized): Secondary | ICD-10-CM | POA: Diagnosis not present

## 2014-10-03 DIAGNOSIS — I10 Essential (primary) hypertension: Secondary | ICD-10-CM | POA: Diagnosis not present

## 2014-10-03 DIAGNOSIS — I482 Chronic atrial fibrillation, unspecified: Secondary | ICD-10-CM

## 2014-10-03 DIAGNOSIS — E119 Type 2 diabetes mellitus without complications: Secondary | ICD-10-CM | POA: Insufficient documentation

## 2014-10-03 DIAGNOSIS — J441 Chronic obstructive pulmonary disease with (acute) exacerbation: Secondary | ICD-10-CM | POA: Insufficient documentation

## 2014-10-03 DIAGNOSIS — I4891 Unspecified atrial fibrillation: Secondary | ICD-10-CM

## 2014-10-03 HISTORY — DX: Chronic obstructive pulmonary disease, unspecified: J44.9

## 2014-10-03 HISTORY — DX: Transient cerebral ischemic attack, unspecified: G45.9

## 2014-10-03 HISTORY — DX: Right heart failure, unspecified: I50.810

## 2014-10-03 HISTORY — DX: Cardiac murmur, unspecified: R01.1

## 2014-10-03 HISTORY — DX: Encounter for other specified aftercare: Z51.89

## 2014-10-03 HISTORY — DX: Essential (primary) hypertension: I10

## 2014-10-03 HISTORY — DX: Anemia, unspecified: D64.9

## 2014-10-03 HISTORY — DX: Interstitial pulmonary disease, unspecified: J84.9

## 2014-10-03 HISTORY — DX: Presence of prosthetic heart valve: Z95.2

## 2014-10-03 HISTORY — DX: Type 2 diabetes mellitus without complications: E11.9

## 2014-10-03 HISTORY — DX: Unspecified asthma, uncomplicated: J45.909

## 2014-10-03 HISTORY — DX: Pulmonary hypertension, unspecified: I27.20

## 2014-10-03 HISTORY — DX: Other pulmonary embolism without acute cor pulmonale: I26.99

## 2014-10-03 HISTORY — DX: Unspecified osteoarthritis, unspecified site: M19.90

## 2014-10-03 HISTORY — DX: Unspecified atrial fibrillation: I48.91

## 2014-10-03 LAB — CBC
HEMATOCRIT: 36.5 % (ref 35.0–47.0)
HEMOGLOBIN: 12.3 g/dL (ref 12.0–16.0)
MCH: 29.9 pg (ref 26.0–34.0)
MCHC: 33.6 g/dL (ref 32.0–36.0)
MCV: 88.8 fL (ref 80.0–100.0)
Platelets: 175 10*3/uL (ref 150–440)
RBC: 4.11 MIL/uL (ref 3.80–5.20)
RDW: 16.3 % — ABNORMAL HIGH (ref 11.5–14.5)
WBC: 11.5 10*3/uL — ABNORMAL HIGH (ref 3.6–11.0)

## 2014-10-03 LAB — BASIC METABOLIC PANEL
ANION GAP: 10 (ref 5–15)
BUN: 21 mg/dL — AB (ref 6–20)
CHLORIDE: 98 mmol/L — AB (ref 101–111)
CO2: 23 mmol/L (ref 22–32)
CREATININE: 0.9 mg/dL (ref 0.44–1.00)
Calcium: 8.6 mg/dL — ABNORMAL LOW (ref 8.9–10.3)
GFR calc non Af Amer: >60 mL/min (ref >60)
Glucose, Bld: 174 mg/dL — ABNORMAL HIGH (ref 65–99)
POTASSIUM: 4 mmol/L (ref 3.5–5.1)
SODIUM: 131 mmol/L — AB (ref 135–145)

## 2014-10-03 LAB — TYPE AND SCREEN
ABO/RH(D): A POS
Antibody Screen: NEGATIVE

## 2014-10-03 LAB — ABO/RH: ABO/RH(D): A POS

## 2014-10-03 LAB — TROPONIN I: TROPONIN I: 0.03 ng/mL (ref <0.031)

## 2014-10-03 MED ORDER — ONDANSETRON HCL 4 MG/2ML IJ SOLN
4.0000 mg | Freq: Once | INTRAMUSCULAR | Status: AC
  Administered 2014-10-03: 4 mg via INTRAVENOUS

## 2014-10-03 MED ORDER — SODIUM CHLORIDE 0.9 % IV BOLUS (SEPSIS)
500.0000 mL | Freq: Once | INTRAVENOUS | Status: AC
  Administered 2014-10-03: 500 mL via INTRAVENOUS

## 2014-10-03 NOTE — ED Provider Notes (Signed)
[facility] Emergency Department Provider Note  ____________________________________________  Time seen: Approximately 111 AM  I have reviewed the triage vital signs and the nursing notes.   HISTORY  Chief Complaint Epistaxis    HPI Erin Mcdowell is a 64 y.o. female who comes in with a nosebleed tonight. The patient has a significant medical history including pulmonary hypertension, mitral valve replacement, atrial fibrillation, CHF, CVA and is on 5 L of O2 at home. The patient had a nosebleed earlier this week and was seen by Dr. Jenne Campus and had her nose cauterized. The patient reports that she saw her doctor yesterday and had an INR of 4.1. Tonight the patient started having a nosebleed out of the left knee there that was more significant than her nosebleed on Monday. The patient's family requested to go to [facility] initially but given the significant amount of bleeding and the fact that the patient was coughing up clots it was felt that the patient should come in to have the bleeding better controlled in the emergency department. The patient is nauseous and has generalized weakness. She denies any significant pain at this time.   Past Medical History  Diagnosis Date  . Asthma   . COPD (chronic obstructive pulmonary disease)   . Diabetes mellitus without complication   . Hypertension   . Interstitial lung disease   . Blood transfusion without reported diagnosis   . Atrial fibrillation   . TIA (transient ischemic attack)   . Pulmonary hypertension   . Mitral valve replaced   . Heart murmur   . Anemia   . Pulmonary embolism   . Right heart failure   . Osteoarthritis     There are no active problems to display for this patient.   Past Surgical History  Procedure Laterality Date  . Mitral valve replaced    . Cardiac catheterization    . Cardiac valave replacment    . Tubal ligation      No current outpatient prescriptions on  file.  Allergies Aspirin; Penicillins; and Tylenol  History reviewed. No pertinent family history.  Social History History  Substance Use Topics  . Smoking status: Never Smoker   . Smokeless tobacco: Never Used  . Alcohol Use: No    Review of Systems Constitutional: No fever/chills Eyes: No visual changes. ENT: Epistaxis Cardiovascular: Denies chest pain. Respiratory:shortness of breath. Gastrointestinal: Nausea and vomiting with No abdominal pain No diarrhea.  No constipation. Genitourinary: Negative for dysuria. Musculoskeletal: Negative for back pain. Skin: Negative for rash. Neurological: Generalized weakness  10-point ROS otherwise negative.  ____________________________________________   PHYSICAL EXAM:  VITAL SIGNS: ED Triage Vitals  Enc Vitals Group     BP 10/03/14 0129 159/90 mmHg     Pulse Rate 10/03/14 0129 124     Resp --      Temp --      Temp src --      SpO2 10/03/14 0129 88 %     Weight 10/03/14 0129 127 lb 13.9 oz (58.001 kg)     Height 10/03/14 0129  (1.499 m)     Head Cir --      Peak Flow --      Pain Score --      Pain Loc --      Pain Edu? --      Excl. in GC? --     Constitutional: Alert and oriented. Ill appearing and in moderate distress. Eyes: Conjunctivae are normal. PERRL. EOMI.  Head: Atraumatic. Nose: Left nare epistaxis Mouth/Throat: Blood in mouth and oropharynx Cardiovascular: Tachycardia, irregularly irregular rate and rhythm. Systolic vital signs.  Good peripheral circulation. Respiratory: Normal respiratory effort.  No retractions. Lungs CTAB. Gastrointestinal: Soft and nontender. No distention. No abdominal bruits. No CVA tenderness. Genitourinary: deferred Musculoskeletal: No lower extremity tenderness nor edema.  No joint effusions. Neurologic:  Normal speech and language. No gross focal neurologic deficits are appreciated.  Skin:  Skin is warm, dry and intact. No rash noted. Psychiatric: Mood and affect are  normal.   ____________________________________________   LABS (all labs ordered are listed, but only abnormal results are displayed)  Labs Reviewed  CBC - Abnormal; Notable for the following:    WBC 11.5 (*)    RDW 16.3 (*)    All other components within normal limits  BASIC METABOLIC PANEL  TROPONIN I  TYPE AND SCREEN   ____________________________________________  EKG  ED ECG REPORT I, Rebecka Apley, the attending physician, personally viewed and interpreted this ECG.   Date: 10/03/2014  EKG Time: 144  Rate: 120  Rhythm: atrial fibrillation, rate 120  Axis: right   Intervals:incomplete right bundle branch block  ST&T Change: st segment depression leads II, III, avf, V4-46  ____________________________________________  RADIOLOGY  none ____________________________________________   PROCEDURES  Procedure(s) performed: None  Critical Care performed: Yes, see critical care note(s)  CRITICAL CARE Performed by: Lucrezia Europe P   Total critical care time:  Critical care time was exclusive of separately billable procedures and treating other patients.  Critical care was necessary to treat or prevent imminent or life-threatening deterioration.  Critical care was time spent personally by me on the following activities: development of treatment plan with patient and/or surrogate as well as nursing, discussions with consultants, evaluation of patient's response to treatment, examination of patient, obtaining history from patient or surrogate, ordering and performing treatments and interventions, ordering and review of laboratory studies, ordering and review of radiographic studies, pulse oximetry and re-evaluation of patient's condition.  ____________________________________________   INITIAL IMPRESSION / ASSESSMENT AND PLAN / ED COURSE  Pertinent labs & imaging results that were available during my care of the patient were reviewed by me and  considered in my medical decision making (see chart for details).  The patient is a 64 year old female who comes in today with a nosebleed. I did place packing in the patient's left nare on arrival. The patient's nare was packed with Muracel. There was some lubrication placed on the packing prior to placement. The bleeding slowed down but did continue so I also placed a clamp on the patient's nose to help slow down and stopped the bleeding. During that time the patient's daughter asked and insisted that we transfer her to [facility] given her complicated medical history. I did contact [facility] and they reported that the patient needed any setting physician. At the same time the patient's daughter contacted the patient's pulmonologist who accepted the patient to the ED so that the patient can be further evaluated for her nosebleed. The patient does have some tachycardia which may be due to acute blood loss. We will check some labs and give the patient a small bolus of normal saline to help replace her fluid losses. The patient's blood pressure is stable at this time and we will reassess the patient's heart rate after she receives the normal saline bolus. The patient will also be transferred to [facility] for further treatment at the request of the family. ____________________________________________  FINAL CLINICAL IMPRESSION(S) / ED DIAGNOSES  Final diagnoses:  Epistaxis  Chronic atrial fibrillation  Atrial fibrillation with rapid ventricular response      Rebecka Apley, MD 10/03/14 0330

## 2014-10-03 NOTE — ED Notes (Signed)
According to pts daughter, pt began to profusely bleed from her nostrils at midnight. Pt daughter states pt had a nasal cauterization this past Tuesday.

## 2015-04-02 ENCOUNTER — Other Ambulatory Visit
Admission: RE | Admit: 2015-04-02 | Discharge: 2015-04-02 | Disposition: A | Discharge status: Home / Self Care | Payer: Self-pay | Source: Ambulatory Visit | Attending: Emergency Medicine | Admitting: Emergency Medicine

## 2015-04-02 DIAGNOSIS — I482 Chronic atrial fibrillation: Secondary | ICD-10-CM | POA: Insufficient documentation

## 2015-04-02 LAB — DIGOXIN LEVEL: DIGOXIN LVL: 0.8 ng/mL (ref 0.8–2.0)

## 2015-04-04 ENCOUNTER — Other Ambulatory Visit
Admission: RE | Admit: 2015-04-04 | Discharge: 2015-04-04 | Disposition: A | Discharge status: Home / Self Care | Payer: Medicare Other | Source: Ambulatory Visit | Attending: Emergency Medicine | Admitting: Emergency Medicine

## 2015-04-04 DIAGNOSIS — I4891 Unspecified atrial fibrillation: Secondary | ICD-10-CM | POA: Diagnosis present

## 2015-04-04 LAB — DIGOXIN LEVEL: Digoxin Level: 0.5 ng/mL — ABNORMAL LOW (ref 0.8–2.0)

## 2015-04-04 LAB — PROTIME-INR
INR: 2.61
Prothrombin Time: 27.6 seconds — ABNORMAL HIGH (ref 11.4–15.0)

## 2017-06-11 DEATH — deceased
